# Patient Record
Sex: Male | Born: 1995 | Hispanic: Yes | Marital: Single | Smoking: Former smoker
Health system: Southern US, Community
[De-identification: ages and names within clinical notes are randomized; demographics above are authoritative.]

## PROBLEM LIST (undated history)

## (undated) DIAGNOSIS — S83209A Unspecified tear of unspecified meniscus, current injury, unspecified knee, initial encounter: Secondary | ICD-10-CM

---
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
## 2016-01-27 ENCOUNTER — Encounter: Payer: Self-pay | Admitting: Family Medicine

## 2016-01-27 ENCOUNTER — Ambulatory Visit (INDEPENDENT_AMBULATORY_CARE_PROVIDER_SITE_OTHER): Payer: No Typology Code available for payment source | Admitting: Family Medicine

## 2016-01-27 ENCOUNTER — Ambulatory Visit (INDEPENDENT_AMBULATORY_CARE_PROVIDER_SITE_OTHER): Payer: No Typology Code available for payment source

## 2016-01-27 VITALS — BP 128/80 | HR 53 | Temp 97.6°F | Ht 70.5 in | Wt 118.8 lb

## 2016-01-27 DIAGNOSIS — S838X2A Sprain of other specified parts of left knee, initial encounter: Secondary | ICD-10-CM

## 2016-01-27 DIAGNOSIS — M25562 Pain in left knee: Secondary | ICD-10-CM

## 2016-01-27 DIAGNOSIS — S8392XA Sprain of unspecified site of left knee, initial encounter: Secondary | ICD-10-CM | POA: Diagnosis not present

## 2016-01-27 DIAGNOSIS — T1490XA Injury, unspecified, initial encounter: Secondary | ICD-10-CM

## 2016-01-27 NOTE — Progress Notes (Addendum)
   HPI  Patient presents today with left pain.  Patient explains that in November he had a similar injury which has resolved. About one week ago he was kept in the foot by another soccer player and hyperextended his knee causing his pain. Since that time he's had locking and popping symptoms, swelling which has gradually improved, and continued pain whenever he tries to flex his knee.  He denies any difficulty bearing weight or knee instability.  He has been avoiding knee bending exercises and active play since that time. He's been using ice, NSAIDs, and compression during blood  PMH: Smoking status noted ROS: Per HPI  Objective: BP 128/80 mmHg  Pulse 53  Temp(Src) 97.6 F (36.4 C) (Oral)  Ht 5' 10.5" (1.791 m)  Wt 118 lb 12.8 oz (53.887 kg)  BMI 16.80 kg/m2 Gen: NAD, alert, cooperative with exam HEENT: NCAT Ext: No edema, warm Neuro: Alert and oriented, No gross deficits  MSK: L knee without erythema, effusion, bruising, or gross deformity Medial joint line tenderness.  ligamentously intact to Lachman's and with varus and valgus stress.  Negative McMurray's test Symmetric laxity on MCL  Plain film knee No acute findings  Assessment and plan:  # Injury, suspected meniscal injury He is locking and popping symptoms and swelling consistent with meniscal injury Considering that he is I will refer him to orthopedic surgery Recommended continued icing, NSAIDs, and avoiding bending exercises Have written him a letter asking that he not engage in any knee flexion exercises or active play for the next 2 weeks.   Orders Placed This Encounter  Procedures  . DG Knee 1-2 Views Left    Standing Status: Future     Number of Occurrences: 1     Standing Expiration Date: 03/28/2017    Order Specific Question:  Reason for Exam (SYMPTOM  OR DIAGNOSIS REQUIRED)    Answer:  soccer injury    Order Specific Question:  Preferred imaging location?    Answer:  Internal  . Ambulatory  referral to Orthopedic Surgery    Referral Priority:  Routine    Referral Type:  Surgical    Referral Reason:  Specialty Services Required    Requested Specialty:  Orthopedic Surgery    Number of Visits Requested:  1     Murtis Sink, MD [facility] 01/27/2016, 3:25 PM

## 2016-01-27 NOTE — Patient Instructions (Signed)
Great to meet you!  I am concerned you have injured the meniscus in your knee Wear a knee sleeve during practice or running  We will work on an orthopedic referral

## 2016-03-09 ENCOUNTER — Ambulatory Visit: Payer: No Typology Code available for payment source | Admitting: Family Medicine

## 2016-03-10 ENCOUNTER — Encounter: Payer: Self-pay | Admitting: Pediatrics

## 2016-03-30 ENCOUNTER — Encounter (HOSPITAL_COMMUNITY): Payer: Self-pay | Admitting: *Deleted

## 2016-03-30 NOTE — Progress Notes (Signed)
Pt denies SOB, chest pain, and being under the care of a cardiologist. Pt denies having a stress test, echo and cardiac cath. Pt denies having labs within the last 2 weeks. Pt denies having a chest x ray and EKG within the last year. Pt made aware to stop taking Aspirin, vitamins, fish oil and herbal medications. Do not take any NSAIDs ie: Ibuprofen, Advil, Naproxen, BC and Goody Powder or any medication containing Aspirin. Pt verbalized understanding of all pre-op instructions.

## 2016-03-31 ENCOUNTER — Ambulatory Visit (HOSPITAL_COMMUNITY)
Admission: RE | Admit: 2016-03-31 | Discharge: 2016-03-31 | Disposition: A | Discharge status: Home / Self Care | Payer: No Typology Code available for payment source | Source: Ambulatory Visit | Attending: Orthopedic Surgery | Admitting: Orthopedic Surgery

## 2016-03-31 ENCOUNTER — Ambulatory Visit (HOSPITAL_COMMUNITY): Payer: No Typology Code available for payment source | Admitting: Anesthesiology

## 2016-03-31 ENCOUNTER — Encounter (HOSPITAL_COMMUNITY)
Admission: RE | Disposition: A | Discharge status: Home / Self Care | Payer: Self-pay | Source: Ambulatory Visit | Attending: Orthopedic Surgery

## 2016-03-31 ENCOUNTER — Encounter (HOSPITAL_COMMUNITY): Payer: Self-pay | Admitting: *Deleted

## 2016-03-31 DIAGNOSIS — S83242A Other tear of medial meniscus, current injury, left knee, initial encounter: Secondary | ICD-10-CM | POA: Diagnosis present

## 2016-03-31 DIAGNOSIS — Z87891 Personal history of nicotine dependence: Secondary | ICD-10-CM | POA: Diagnosis not present

## 2016-03-31 DIAGNOSIS — X501XXA Overexertion from prolonged static or awkward postures, initial encounter: Secondary | ICD-10-CM | POA: Diagnosis not present

## 2016-03-31 HISTORY — PX: KNEE ARTHROSCOPY WITH MEDIAL MENISECTOMY: SHX5651

## 2016-03-31 HISTORY — DX: Unspecified tear of unspecified meniscus, current injury, unspecified knee, initial encounter: S83.209A

## 2016-03-31 LAB — CBC
HEMATOCRIT: 38.2 % — AB (ref 39.0–52.0)
HEMOGLOBIN: 13 g/dL (ref 13.0–17.0)
MCH: 29 pg (ref 26.0–34.0)
MCHC: 34 g/dL (ref 30.0–36.0)
MCV: 85.1 fL (ref 78.0–100.0)
Platelets: 216 10*3/uL (ref 150–400)
RBC: 4.49 MIL/uL (ref 4.22–5.81)
RDW: 12.9 % (ref 11.5–15.5)
WBC: 3.9 10*3/uL — ABNORMAL LOW (ref 4.0–10.5)

## 2016-03-31 SURGERY — ARTHROSCOPY, KNEE, WITH MEDIAL MENISCECTOMY
Anesthesia: General | Site: Knee | Laterality: Left

## 2016-03-31 MED ORDER — CEFAZOLIN IN D5W 2 GM/100ML IV SOLN
2.0000 g | Freq: Once | INTRAVENOUS | Status: AC
Start: 2016-03-31 — End: 2016-03-31
  Administered 2016-03-31: 2 g via INTRAVENOUS
  Filled 2016-03-31: qty 1

## 2016-03-31 MED ORDER — LACTATED RINGERS IV SOLN
INTRAVENOUS | Status: DC
  Administered 2016-03-31: 09:00:00 via INTRAVENOUS

## 2016-03-31 MED ORDER — MORPHINE SULFATE (PF) 4 MG/ML IV SOLN
INTRAVENOUS | Status: AC
  Filled 2016-03-31: qty 1

## 2016-03-31 MED ORDER — OXYCODONE-ACETAMINOPHEN 5-325 MG PO TABS
1.0000 | ORAL_TABLET | Freq: Once | ORAL | Status: AC
  Administered 2016-03-31: 2 via ORAL

## 2016-03-31 MED ORDER — BUPIVACAINE HCL (PF) 0.25 % IJ SOLN
INTRAMUSCULAR | Status: AC
  Filled 2016-03-31: qty 30

## 2016-03-31 MED ORDER — ONDANSETRON HCL 4 MG/2ML IJ SOLN
INTRAMUSCULAR | Status: DC | PRN
  Administered 2016-03-31: 4 mg via INTRAVENOUS

## 2016-03-31 MED ORDER — HYDROMORPHONE HCL 1 MG/ML IJ SOLN: 0.2500 mg | INTRAMUSCULAR | Status: DC | PRN

## 2016-03-31 MED ORDER — PROPOFOL 10 MG/ML IV BOLUS
INTRAVENOUS | Status: AC
  Filled 2016-03-31: qty 20

## 2016-03-31 MED ORDER — MORPHINE SULFATE (PF) 4 MG/ML IV SOLN
INTRAVENOUS | Status: DC | PRN
  Administered 2016-03-31: 4 mg via SUBCUTANEOUS

## 2016-03-31 MED ORDER — FENTANYL CITRATE (PF) 250 MCG/5ML IJ SOLN
INTRAMUSCULAR | Status: AC
  Filled 2016-03-31: qty 5

## 2016-03-31 MED ORDER — BUPIVACAINE HCL (PF) 0.25 % IJ SOLN
INTRAMUSCULAR | Status: DC | PRN
  Administered 2016-03-31: 30 mL

## 2016-03-31 MED ORDER — MIDAZOLAM HCL 5 MG/5ML IJ SOLN
INTRAMUSCULAR | Status: DC | PRN
  Administered 2016-03-31: 2 mg via INTRAVENOUS

## 2016-03-31 MED ORDER — PROPOFOL 10 MG/ML IV BOLUS
INTRAVENOUS | Status: DC | PRN
  Administered 2016-03-31: 200 mg via INTRAVENOUS

## 2016-03-31 MED ORDER — FENTANYL CITRATE (PF) 100 MCG/2ML IJ SOLN
INTRAMUSCULAR | Status: DC | PRN
  Administered 2016-03-31 (×2): 50 ug via INTRAVENOUS

## 2016-03-31 MED ORDER — MIDAZOLAM HCL 2 MG/2ML IJ SOLN
INTRAMUSCULAR | Status: AC
  Filled 2016-03-31: qty 2

## 2016-03-31 MED ORDER — FENTANYL CITRATE (PF) 100 MCG/2ML IJ SOLN
INTRAMUSCULAR | Status: AC
  Filled 2016-03-31: qty 2

## 2016-03-31 MED ORDER — OXYCODONE-ACETAMINOPHEN 5-325 MG PO TABS
ORAL_TABLET | ORAL | Status: AC
  Filled 2016-03-31: qty 2

## 2016-03-31 MED ORDER — OXYCODONE-ACETAMINOPHEN 5-325 MG PO TABS: 1.0000 | ORAL_TABLET | ORAL | Status: AC | PRN

## 2016-03-31 MED ORDER — SODIUM CHLORIDE 0.9 % IR SOLN
Status: DC | PRN
  Administered 2016-03-31: 3000 mL

## 2016-03-31 MED ORDER — LIDOCAINE HCL (CARDIAC) 20 MG/ML IV SOLN
INTRAVENOUS | Status: DC | PRN
  Administered 2016-03-31: 60 mg via INTRAVENOUS

## 2016-03-31 SURGICAL SUPPLY — 39 items
BANDAGE ELASTIC 6 VELCRO ST LF (GAUZE/BANDAGES/DRESSINGS) ×3 IMPLANT
BLADE CLIPPER SURG (BLADE) ×3 IMPLANT
BLADE CUDA 5.5 (BLADE) IMPLANT
BLADE CUTTER GATOR 3.5 (BLADE) ×3 IMPLANT
BLADE GREAT WHITE 4.2 (BLADE) ×2 IMPLANT
BLADE GREAT WHITE 4.2MM (BLADE) ×1
BOOTCOVER CLEANROOM LRG (PROTECTIVE WEAR) ×12 IMPLANT
CLOSURE STERI-STRIP 1/2X4 (GAUZE/BANDAGES/DRESSINGS) ×1
CLOSURE WOUND 1/2 X4 (GAUZE/BANDAGES/DRESSINGS) ×1
CLSR STERI-STRIP ANTIMIC 1/2X4 (GAUZE/BANDAGES/DRESSINGS) ×2 IMPLANT
DRAPE ARTHROSCOPY W/POUCH 114 (DRAPES) ×3 IMPLANT
DRSG PAD ABDOMINAL 8X10 ST (GAUZE/BANDAGES/DRESSINGS) ×6 IMPLANT
DURAPREP 26ML APPLICATOR (WOUND CARE) ×3 IMPLANT
GAUZE SPONGE 4X4 12PLY STRL (GAUZE/BANDAGES/DRESSINGS) ×3 IMPLANT
GLOVE BIO SURGEON STRL SZ7.5 (GLOVE) ×3 IMPLANT
GLOVE BIO SURGEON STRL SZ8 (GLOVE) ×3 IMPLANT
GLOVE EUDERMIC 7 POWDERFREE (GLOVE) ×3 IMPLANT
GLOVE SS BIOGEL STRL SZ 7.5 (GLOVE) ×1 IMPLANT
GLOVE SUPERSENSE BIOGEL SZ 7.5 (GLOVE) ×2
GOWN STRL REUS W/ TWL LRG LVL3 (GOWN DISPOSABLE) ×1 IMPLANT
GOWN STRL REUS W/ TWL XL LVL3 (GOWN DISPOSABLE) ×2 IMPLANT
GOWN STRL REUS W/TWL LRG LVL3 (GOWN DISPOSABLE) ×2
GOWN STRL REUS W/TWL XL LVL3 (GOWN DISPOSABLE) ×4
KIT BASIN OR (CUSTOM PROCEDURE TRAY) ×3 IMPLANT
KIT ROOM TURNOVER OR (KITS) ×3 IMPLANT
MANIFOLD NEPTUNE II (INSTRUMENTS) ×3 IMPLANT
NEEDLE 18GX1X1/2 (RX/OR ONLY) (NEEDLE) ×3 IMPLANT
PACK ARTHROSCOPY DSU (CUSTOM PROCEDURE TRAY) ×3 IMPLANT
PAD ARMBOARD 7.5X6 YLW CONV (MISCELLANEOUS) ×6 IMPLANT
PADDING CAST COTTON 6X4 STRL (CAST SUPPLIES) ×3 IMPLANT
RING FOAM WHITE (MISCELLANEOUS) ×3 IMPLANT
SET ARTHROSCOPY TUBING (MISCELLANEOUS) ×2
SET ARTHROSCOPY TUBING LN (MISCELLANEOUS) ×1 IMPLANT
SPONGE GAUZE 4X4 12PLY STER LF (GAUZE/BANDAGES/DRESSINGS) ×3 IMPLANT
SPONGE LAP 4X18 X RAY DECT (DISPOSABLE) ×3 IMPLANT
STRIP CLOSURE SKIN 1/2X4 (GAUZE/BANDAGES/DRESSINGS) ×2 IMPLANT
SYR 30ML LL (SYRINGE) ×3 IMPLANT
TOWEL OR 17X24 6PK STRL BLUE (TOWEL DISPOSABLE) ×3 IMPLANT
WATER STERILE IRR 1000ML POUR (IV SOLUTION) ×3 IMPLANT

## 2016-03-31 NOTE — Op Note (Signed)
Antonio Hawkins, Antonio Hawkins                  ACCOUNT NO.:  [id number]  MEDICAL RECORD NO.:  [id number]  LOCATION:  [facility]                         FACILITY:  [facility]  PHYSICIAN:  Vania Rea. Malyk Girouard, M.D.  DATE OF BIRTH:  Sep 25, 1996  DATE OF PROCEDURE:  03/31/2016 DATE OF DISCHARGE:                              OPERATIVE REPORT   PREOPERATIVE DIAGNOSIS:  Left knee medial meniscus tear.  POSTOPERATIVE DIAGNOSIS:  Left knee medial meniscus tear.  PROCEDURES: 1. Left knee diagnostic arthroscopy. 2. Partial medial meniscectomy.  SURGEON:  Vania Rea. Arieanna Pressey, M.D.  Threasa HeadsFrench Ana A. Shuford, PA-C.  ANESTHESIA:  General endotracheal as well as local.  TOURNIQUET TIME:  None was used.  BLOOD LOSS:  Minimal.  DRAINS:  None.  HISTORY:  Antonio Hawkins is an 20 year old male, who originally injured his left knee with a twisting mechanism and is continued to have pain, swelling, intermittent mechanical symptoms of locking, popping giving way with examination showing a discrete medial joint line tenderness. MRI scan demonstrating a displaced tear of the medial meniscus.  Due to his ongoing painful and mechanical symptoms, he was brought to the operating room at this time for planned left knee arthroscopy as described below.  Preoperatively, I counseled Antonio Hawkins regarding treatment options and potential risks versus benefits thereof.  Possible surgical complications were all reviewed including bleeding, infection, neurovascular injury, DVT, PE, as well as persistent pain.  He understands and accepts and agrees with our planned procedure.  PROCEDURE IN DETAIL:  After undergoing routine preop evaluation, the patient received prophylactic antibiotics, brought to the operating room, placed supine on the operating table, underwent smooth induction of a general endotracheal anesthesia.  Left leg was placed in the leg holder and sterilely prepped and draped in standard fashion.  Time-out was called.  Standard  arthroscopy portal was established and diagnostic arthroscopy was performed.  The suprapatellar pouch and gutters were clear.  Patellofemoral joint showed articular surface to be in pristine condition, normal patellar tracking.  The intercondylar notch showed the ACL and PCL to be intact.  Medially, there was a displaced pedunculated tear of the posterior mid-thirds of medial meniscus with a fragment pedunculated on anterior stock and flips at the anterior chamber of the knee.  This was markedly deformed and clearly was a chronic tear and given these changes and morphologic findings, we proceeded with a removal of the pedunculated fragment of the medial meniscus and then used a shaver to trim back the stalk to a smooth margin.  I would estimate the approximately 60% of the meniscus was excised, but there was indeed an intact peripheral rim through the middle and posterior aspects of the medial meniscus and we carefully probed and inspected the remaining posterior and medial rim of the remnants of the meniscus and confirmed that it was all stable.  The articular surfaces were otherwise good condition.  Laterally, articular surfaces were in excellent condition.  Medial and lateral meniscus was intact and stable.  At this point, final inspection and irrigation was then completed.  Fluid and instruments were removed.  The portal was closed with Steri-Strips with combination of Marcaine and plain marker was placed in  the knee joint and at the end of the case, Steri-Strips and dry dressing wrapped about the knee and leg was wrapped with dry dressing, Ace bandage and thigh support stocking.  The patient was awakened, extubated and taken to the recovery room in stable condition.     Marcia Lepera Vania ReaM. Janeya Deyo, M.D.     KMS/MEDQ  D:  03/31/2016  T:  03/31/2016  Job:  [id number]

## 2016-03-31 NOTE — Anesthesia Preprocedure Evaluation (Addendum)
Anesthesia Evaluation  Patient identified by MRN, date of birth, ID band Patient awake    Reviewed: Allergy & Precautions, NPO status , Patient's Chart, lab work & pertinent test results  Airway Mallampati: I       Dental  (+) Teeth Intact   Pulmonary neg pulmonary ROS, former smoker,    breath sounds clear to auscultation       Cardiovascular negative cardio ROS   Rhythm:Regular Rate:Normal     Neuro/Psych negative neurological ROS  negative psych ROS   GI/Hepatic negative GI ROS, Neg liver ROS,   Endo/Other  negative endocrine ROS  Renal/GU negative Renal ROS  negative genitourinary   Musculoskeletal negative musculoskeletal ROS (+)   Abdominal   Peds negative pediatric ROS (+)  Hematology negative hematology ROS (+)   Anesthesia Other Findings   Reproductive/Obstetrics negative OB ROS                            Anesthesia Physical Anesthesia Plan  ASA: I  Anesthesia Plan: General   Post-op Pain Management:    Induction: Intravenous  Airway Management Planned: LMA  Additional Equipment:   Intra-op Plan:   Post-operative Plan: Extubation in OR  Informed Consent: I have reviewed the patients History and Physical, chart, labs and discussed the procedure including the risks, benefits and alternatives for the proposed anesthesia with the patient or authorized representative who has indicated his/her understanding and acceptance.   Dental advisory given  Plan Discussed with: CRNA and Surgeon  Anesthesia Plan Comments:         Anesthesia Quick Evaluation

## 2016-03-31 NOTE — Anesthesia Procedure Notes (Signed)
Procedure Name: LMA Insertion Date/Time: 03/31/2016 10:07 AM Performed by: Sharlene DoryWALKER, Tykera Skates E Pre-anesthesia Checklist: Patient identified, Emergency Drugs available, Suction available, Patient being monitored and Timeout performed Patient Re-evaluated:Patient Re-evaluated prior to inductionOxygen Delivery Method: Circle system utilized Preoxygenation: Pre-oxygenation with 100% oxygen Intubation Type: IV induction LMA: LMA inserted LMA Size: 4.0 Number of attempts: 1 Placement Confirmation: positive ETCO2 and breath sounds checked- equal and bilateral Tube secured with: Tape Dental Injury: Teeth and Oropharynx as per pre-operative assessment

## 2016-03-31 NOTE — Transfer of Care (Signed)
Immediate Anesthesia Transfer of Care Note  Patient: Antonio Hawkins  Procedure(s) Performed: Procedure(s): LEFT KNEE ARTHROSCOPY WITH MENISCAL DEBRIDEMENT  VERSE REPAIR  (Left)  Patient Location: PACU  Anesthesia Type:General  Level of Consciousness: sedated  Airway & Oxygen Therapy: Patient Spontanous Breathing and Patient connected to nasal cannula oxygen  Post-op Assessment: Report given to RN and Post -op Vital signs reviewed and stable  Post vital signs: Reviewed and stable  Last Vitals:  Filed Vitals:   03/31/16 0842 03/31/16 1100  BP: 141/69 113/54  Pulse: 52   Temp: 36.8 C 36.4 C  Resp: 20     Complications: No apparent anesthesia complications

## 2016-03-31 NOTE — H&P (Signed)
Antonio Hawkins    Chief Complaint: LEFT KNEE MEDIAL MENISCAL TEAR HPI: The patient is a 20 y.o. male with chronic left knee pain and mechanical symptoms.  Past Medical History  Diagnosis Date  . Meniscus tear     left    History reviewed. No pertinent past surgical history.  History reviewed. No pertinent family history.  Social History:  reports that he has quit smoking. His smoking use included Cigarettes and E-cigarettes. He has never used smokeless tobacco. He reports that he does not drink alcohol or use illicit drugs.   Medications Prior to Admission  Medication Sig Dispense Refill  . ibuprofen (ADVIL,MOTRIN) 200 MG tablet Take 400 mg by mouth every 6 (six) hours as needed for moderate pain.        Physical Exam: left knee with medial joint line pain and exam as noted at recent office visits  Vitals  Temp:  [98.2 F (36.8 C)] 98.2 F (36.8 C) (04/06 0842) Pulse Rate:  [52] 52 (04/06 0842) Resp:  [20] 20 (04/06 0842) BP: (141)/(69) 141/69 mmHg (04/06 0842) SpO2:  [100 %] 100 % (04/06 0842) Weight:  [55.792 kg (123 lb)] 55.792 kg (123 lb) (04/06 0842)  Assessment/Plan  Impression: LEFT KNEE MEDIAL MENISCAL TEAR  Plan of Action: Procedure(s): LEFT KNEE ARTHROSCOPY WITH MENISCAL DEBRIDEMENT  VERSE REPAIR   Antonio Hawkins 03/31/2016, 9:33 AM Contact # [phone]

## 2016-03-31 NOTE — Op Note (Signed)
03/31/2016  10:57 AM  PATIENT:   Antonio Hawkins  20 y.o. male  PRE-OPERATIVE DIAGNOSIS:  LEFT KNEE MEDIAL MENISCAL TEAR  POST-OPERATIVE DIAGNOSIS:  same  PROCEDURE:  LKA, partial MM  SURGEON:  Jaydon Avina, Vania ReaKevin M. M.D.  ASSISTANTS: [place]ord pac   ANESTHESIA:   GET + local  EBL: min  SPECIMEN:  none  Drains: none   PATIENT DISPOSITION:  PACU - hemodynamically stable.    PLAN OF CARE: Discharge to home after PACU  Dictation# [id number]   Contact # [phone]

## 2016-03-31 NOTE — Anesthesia Postprocedure Evaluation (Signed)
Anesthesia Post Note  Patient: Shaaron AdlerLuis D Coull  Procedure(s) Performed: Procedure(s) (LRB): LEFT KNEE ARTHROSCOPY WITH MENISCAL DEBRIDEMENT  VERSE REPAIR  (Left)  Patient location during evaluation: PACU Anesthesia Type: General Level of consciousness: awake and alert Pain management: pain level controlled Vital Signs Assessment: post-procedure vital signs reviewed and stable Respiratory status: spontaneous breathing, nonlabored ventilation, respiratory function stable and patient connected to nasal cannula oxygen Cardiovascular status: blood pressure returned to baseline and stable Postop Assessment: no signs of nausea or vomiting Anesthetic complications: no    Last Vitals:  Filed Vitals:   03/31/16 1145 03/31/16 1200  BP: 128/86 129/81  Pulse: 46 46  Temp:    Resp: 13 12    Last Pain:  Filed Vitals:   03/31/16 1247  PainSc: Asleep                 Josue Falconi,JAMES TERRILL

## 2016-03-31 NOTE — Discharge Instructions (Signed)
° °  Vania ReaKevin M. Supple, M.D., F.A.A.O.S. Orthopaedic Surgery Specializing in Arthroscopic and Reconstructive Surgery of the Shoulder and Knee [phone] [address] [place], [place] [id number] - Fax [phone]   POST-OP KNEE ARTHROSCOPY INSTRUCTIONS  Pain You will be expected to have a moderate amount of pain in the affected knee for approximately two weeks. However, the first two days will be the most severe pain. A prescription has been provided to take as needed for the pain. The pain can be reduced by applying ice packs to the knee for the first 1-2 weeks post surgery. Also, keeping the leg elevated on pillows will help alleviate the pain. If you develop any acute pain or swelling in your calf muscle, please call the doctor.  Activity It is preferred that you stay at bed rest for approximately 24 hours. However, you may go to the bathroom with help. Weight bearing as tolerated. You may begin the knee exercises the day of surgery. Discontinue crutches as the knee pain resolves.  Dressing Keep the dressing dry. If the ace bandage should wrinkle or roll up, this can be rewrapped to prevent ridges in the bandage. You may remove all dressings in 48 hours, leave steri-strips in place and apply bandaids to each wound. You may shower on the 3rd day after surgery but no tub bath. Use the support stocking to help reduce swelling until your follow-up visit in the office.  Symptoms to report to your doctor Extreme pain Extreme swelling Temperature above 101 degrees Change in the feeling, color, or movement of your toes Redness, heat, or swelling at your incision  Exercise If is preferred that as soon as possible you try to do a straight leg raise without bending the knee and concentrate on bringing the heel of your foot off the bed up to approximately 45 degrees and hold for the count of 10 seconds. Repeat this at least 10 times three or four times per day. Additional exercises are  provided below.  You are encouraged to bend the knee as tolerated.  Follow-Up Call to schedule a follow-up appointment in 7-10 days.  POST-OP EXERCISES  Short Arc Quads  1. Lie on back with legs straight. Place towel roll under thigh, just above knee. 2. Tighten thigh muscles to straighten knee and lift heel off bed. 3. Hold for slow count of five, then lower. 4. Do three sets of ten    Straight Leg Raises  1. Lie on back with operative leg straight and other leg bent. 2. Keeping operative leg completely straight, slowly lift operative leg so foot is 5 inches off bed. 3. Hold for slow count of five, then lower. 4. Do three sets of ten.    DO BOTH EXERCISES 2 TIMES A DAY  Ankle Pumps  Work/move the operative ankle and foot up and down 10 times every hour while awake.

## 2016-04-01 ENCOUNTER — Encounter (HOSPITAL_COMMUNITY): Payer: Self-pay | Admitting: Orthopedic Surgery

## 2016-04-01 MED FILL — Cefazolin Sodium-Dextrose IV Solution 2 GM/100ML-4%: INTRAVENOUS | Qty: 100 | Status: AC

## 2016-07-16 IMAGING — CR DG KNEE 1-2V*L*
2 series · 2 of 2 positions shown · non-contrast
Comparison: No prior.

CLINICAL DATA: Soccer injury.

EXAM:
LEFT KNEE - 1-2 VIEW

[view not recorded (1 of 2)]
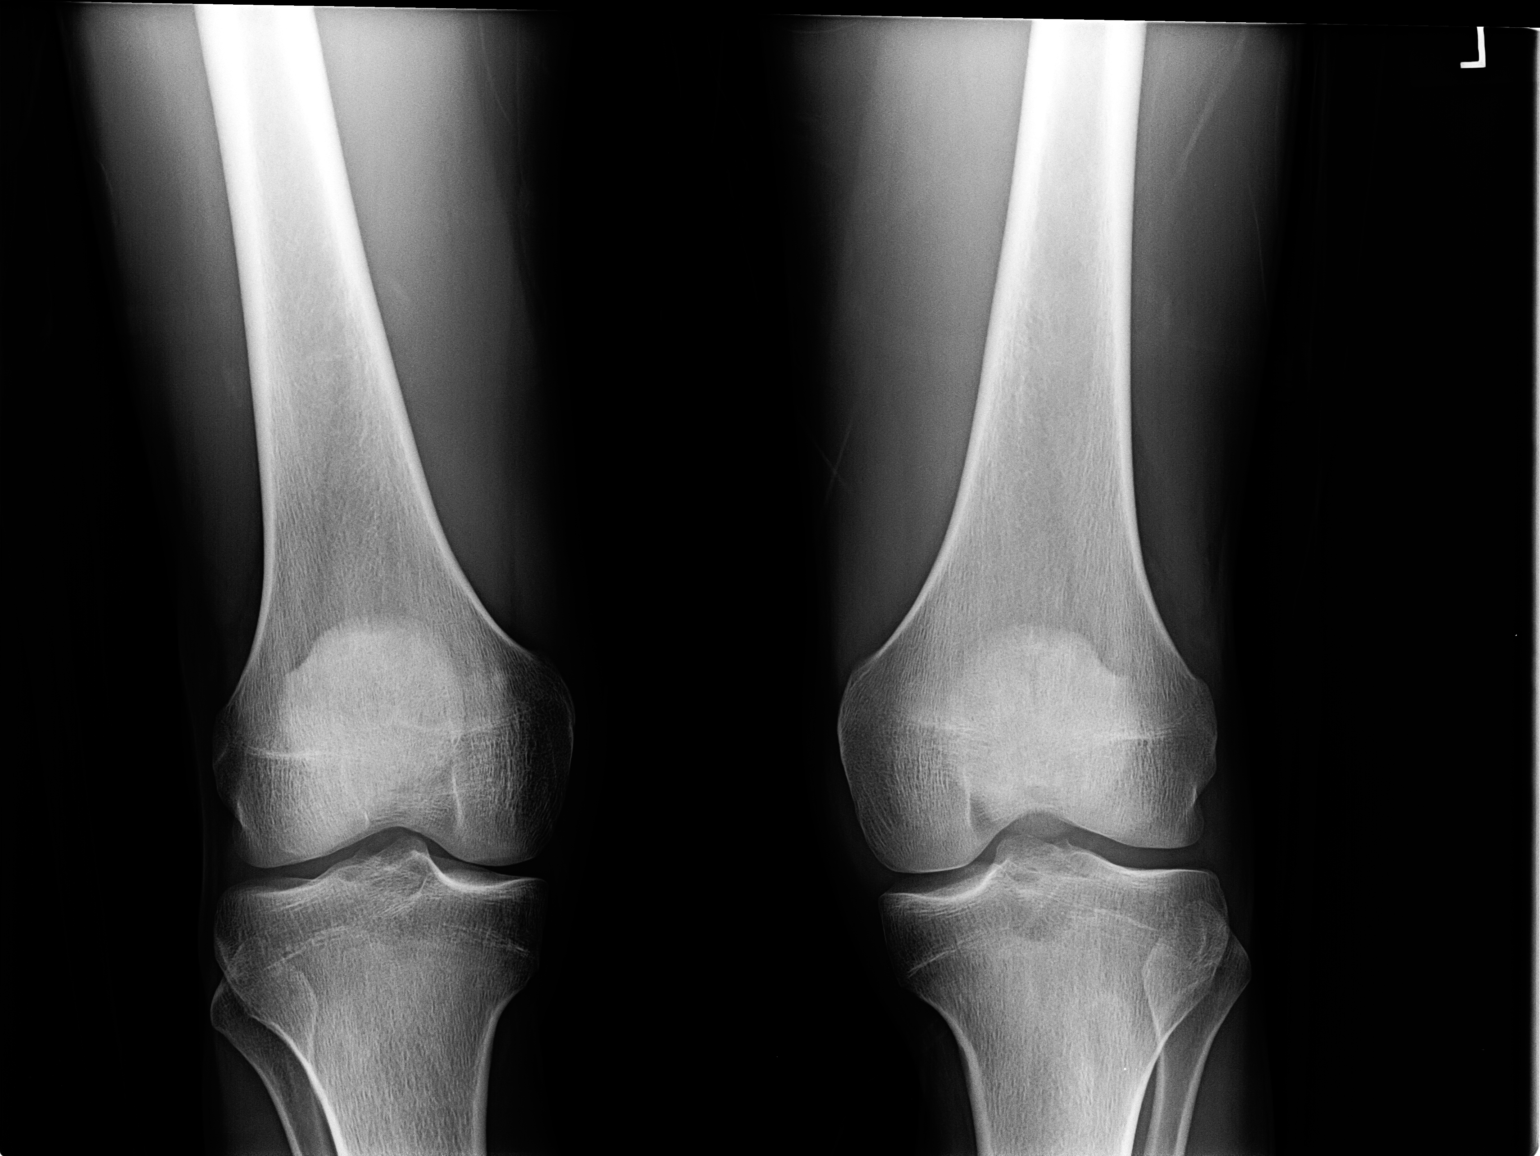

[view not recorded (2 of 2)]
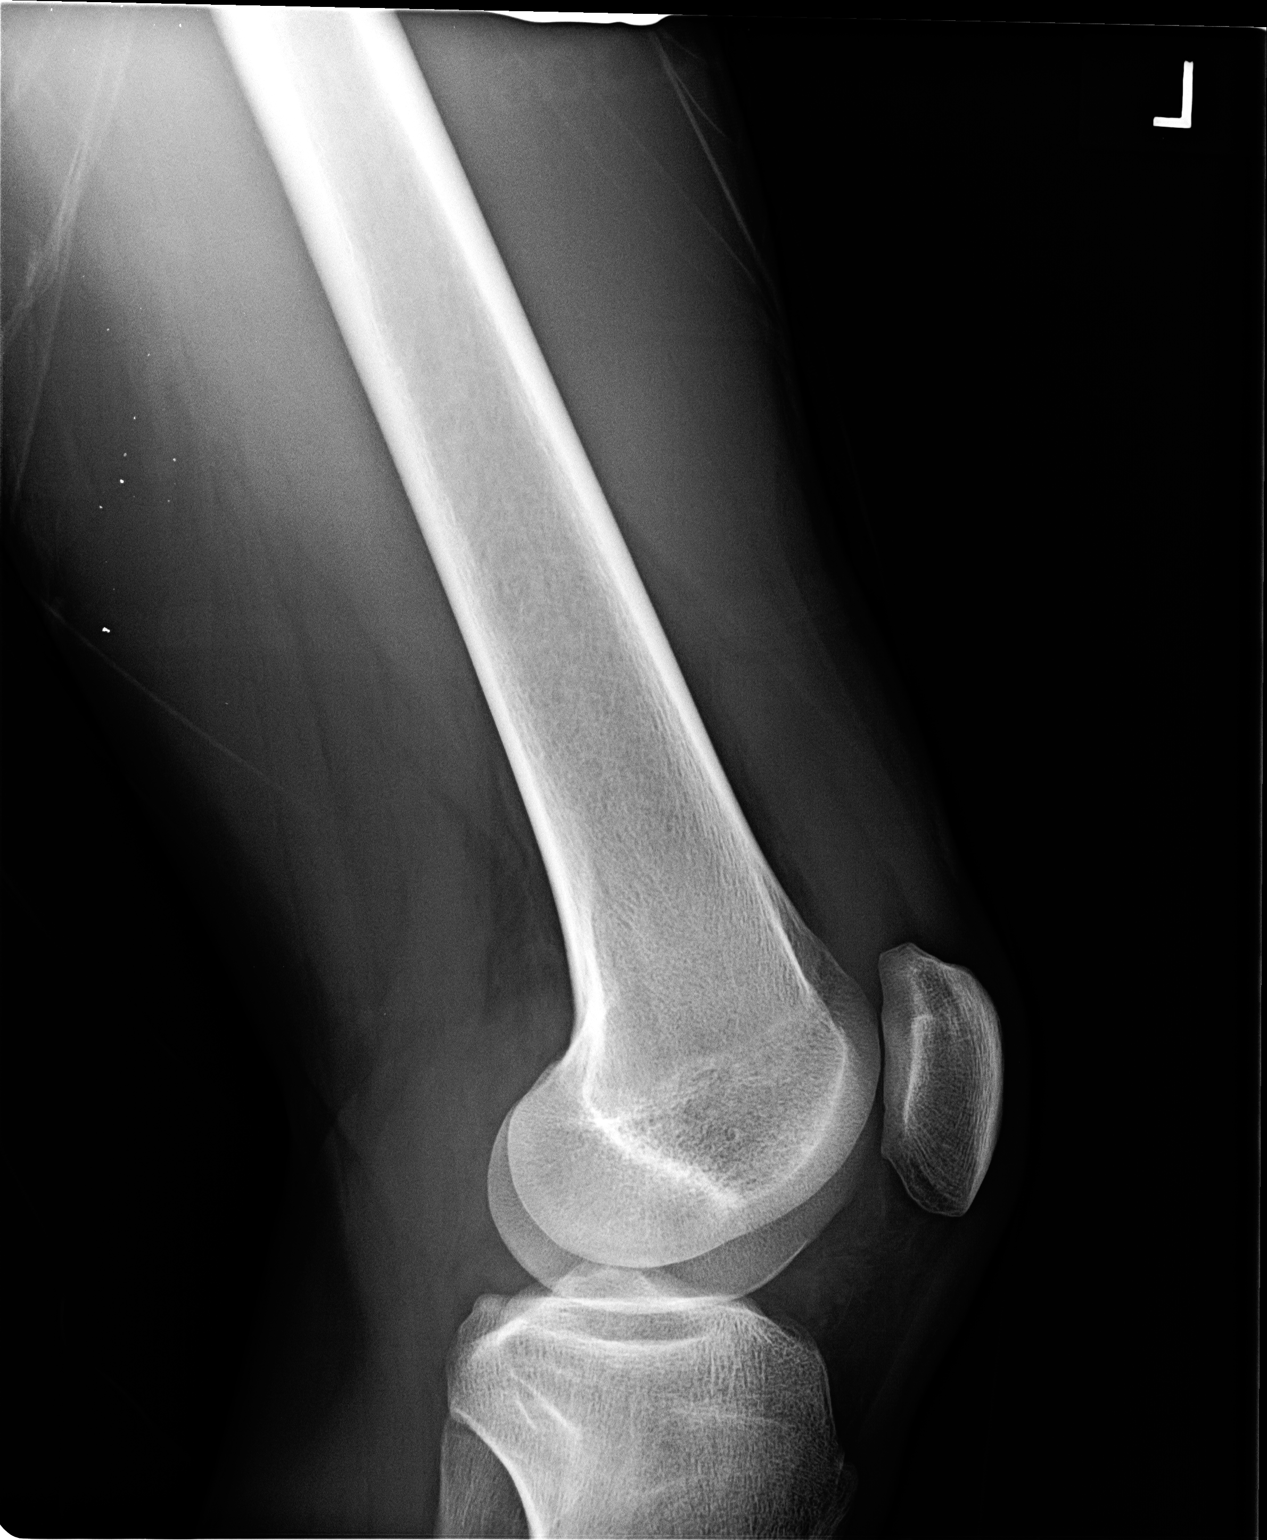

[2 of 2 positions shown; findings below may reference images not displayed]

FINDINGS: No acute bony abnormality identified. No evidence of fracture
dislocation. Small knee joint effusion is noted.
IMPRESSION: Small left knee joint effusion.  No acute bony abnormality.

## 2016-10-14 IMAGING — CR DG KNEE 1-2V*L*
2 series · 2 of 2 positions shown · non-contrast
Comparison: None.

CLINICAL DATA: Soccer injury.  Initial evaluation.

EXAM:
LEFT KNEE - 1-2 VIEW

[view not recorded (1 of 2)]
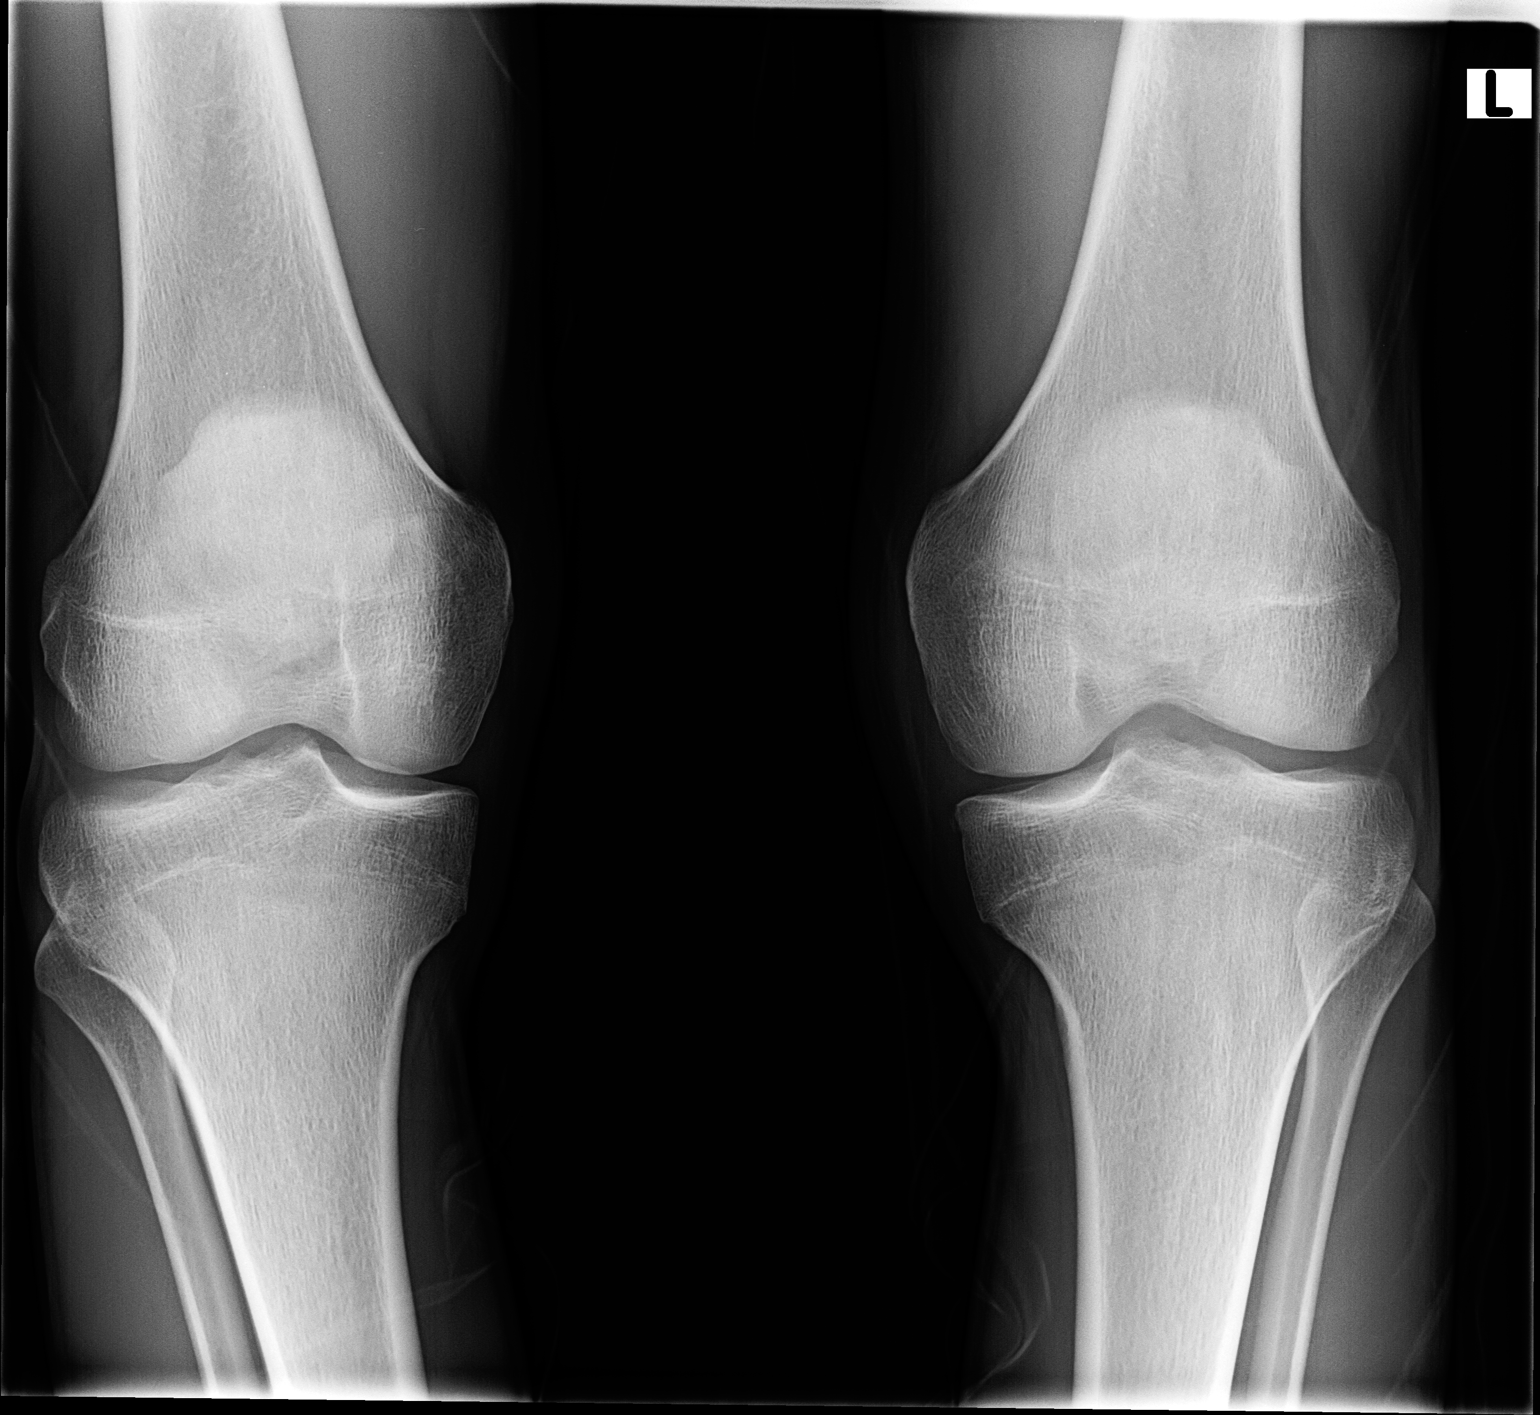

[view not recorded (2 of 2)]
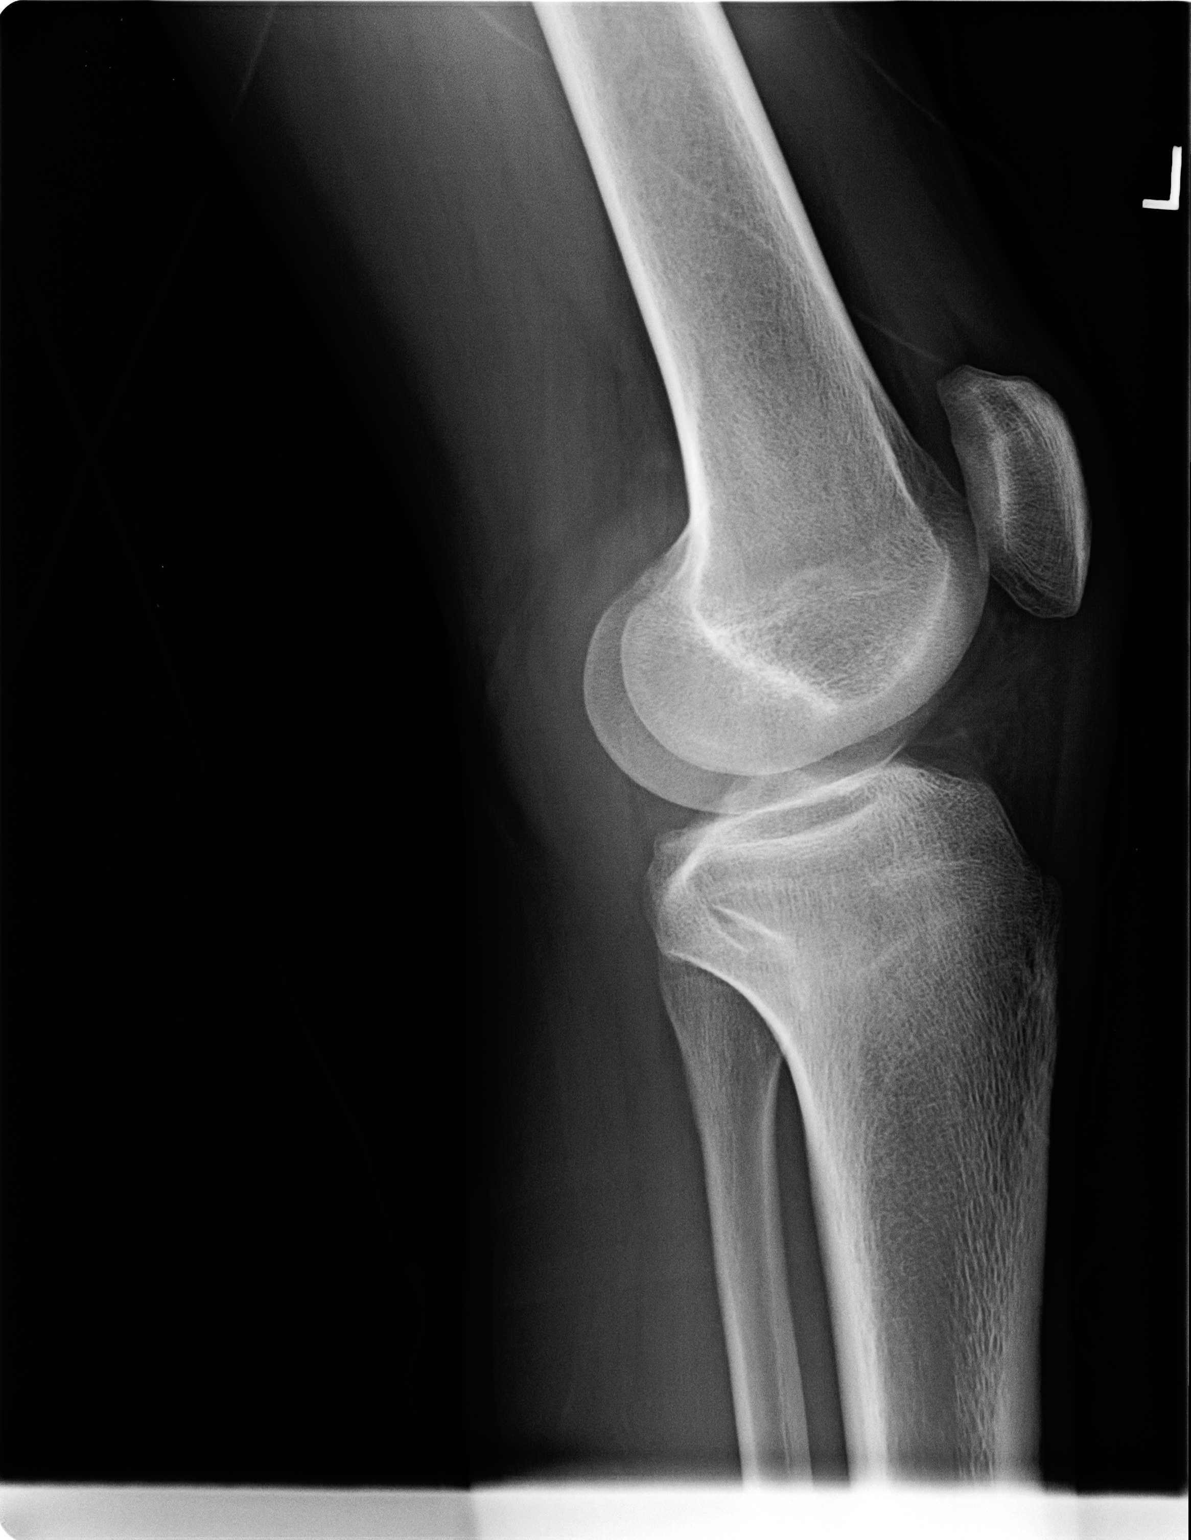

[2 of 2 positions shown; findings below may reference images not displayed]

FINDINGS: No acute bony or joint abnormality identified. No evidence of
fracture or dislocation.
IMPRESSION: No acute abnormality.
